# Patient Record
Sex: Female | Born: 1965 | Race: White | Hispanic: No | Smoking: Current every day smoker
Health system: Southern US, Community
[De-identification: ages and names within clinical notes are randomized; demographics above are authoritative.]

## PROBLEM LIST (undated history)

## (undated) DIAGNOSIS — E785 Hyperlipidemia, unspecified: Secondary | ICD-10-CM

## (undated) DIAGNOSIS — F32A Depression, unspecified: Secondary | ICD-10-CM

## (undated) DIAGNOSIS — E669 Obesity, unspecified: Secondary | ICD-10-CM

## (undated) DIAGNOSIS — F329 Major depressive disorder, single episode, unspecified: Secondary | ICD-10-CM

## (undated) DIAGNOSIS — Z720 Tobacco use: Secondary | ICD-10-CM

## (undated) HISTORY — DX: Obesity, unspecified: E66.9

## (undated) HISTORY — DX: Major depressive disorder, single episode, unspecified: F32.9

## (undated) HISTORY — PX: APPENDECTOMY: SHX54

## (undated) HISTORY — PX: DILATION AND CURETTAGE OF UTERUS: SHX78

## (undated) HISTORY — PX: TONSILLECTOMY AND ADENOIDECTOMY: SHX28

## (undated) HISTORY — DX: Tobacco use: Z72.0

## (undated) HISTORY — DX: Hyperlipidemia, unspecified: E78.5

## (undated) HISTORY — DX: Depression, unspecified: F32.A

---

## 2005-09-25 ENCOUNTER — Ambulatory Visit: Payer: Self-pay

## 2006-10-28 ENCOUNTER — Ambulatory Visit: Payer: Self-pay | Admitting: Family Medicine

## 2009-04-20 ENCOUNTER — Ambulatory Visit: Payer: Self-pay

## 2011-08-26 ENCOUNTER — Ambulatory Visit: Payer: Self-pay

## 2011-10-06 LAB — HM MAMMOGRAPHY

## 2012-10-25 LAB — HM PAP SMEAR

## 2015-01-11 DIAGNOSIS — F329 Major depressive disorder, single episode, unspecified: Secondary | ICD-10-CM | POA: Insufficient documentation

## 2015-01-11 DIAGNOSIS — F17210 Nicotine dependence, cigarettes, uncomplicated: Secondary | ICD-10-CM | POA: Insufficient documentation

## 2015-01-11 DIAGNOSIS — F32A Depression, unspecified: Secondary | ICD-10-CM | POA: Insufficient documentation

## 2015-01-11 DIAGNOSIS — F17200 Nicotine dependence, unspecified, uncomplicated: Secondary | ICD-10-CM

## 2015-01-11 DIAGNOSIS — E669 Obesity, unspecified: Secondary | ICD-10-CM | POA: Insufficient documentation

## 2015-01-11 DIAGNOSIS — E785 Hyperlipidemia, unspecified: Secondary | ICD-10-CM | POA: Insufficient documentation

## 2015-01-12 ENCOUNTER — Encounter: Payer: Self-pay | Admitting: Unknown Physician Specialty

## 2015-01-12 ENCOUNTER — Ambulatory Visit (INDEPENDENT_AMBULATORY_CARE_PROVIDER_SITE_OTHER): Payer: No Typology Code available for payment source | Admitting: Unknown Physician Specialty

## 2015-01-12 VITALS — BP 118/80 | HR 92 | Temp 98.7°F | Wt 206.0 lb

## 2015-01-12 DIAGNOSIS — L509 Urticaria, unspecified: Secondary | ICD-10-CM | POA: Diagnosis not present

## 2015-01-12 DIAGNOSIS — F32A Depression, unspecified: Secondary | ICD-10-CM | POA: Insufficient documentation

## 2015-01-12 DIAGNOSIS — Z720 Tobacco use: Secondary | ICD-10-CM | POA: Insufficient documentation

## 2015-01-12 DIAGNOSIS — F329 Major depressive disorder, single episode, unspecified: Secondary | ICD-10-CM | POA: Insufficient documentation

## 2015-01-12 MED ORDER — PREDNISONE 10 MG PO TABS: 10.0000 mg | ORAL_TABLET | Freq: Every day | ORAL | Status: DC

## 2015-01-12 NOTE — Progress Notes (Signed)
   BP 118/80 mmHg  Pulse 92  Temp(Src) 98.7 F (37.1 C)  Wt 206 lb (93.441 kg)  SpO2 97%   Subjective:    Patient ID: Robin Bartlett, female    DOB: 05-16-1966, 49 y.o.   MRN: [id number]  HPI: Robin Bartlett is a 49 y.o. female  Chief Complaint  Patient presents with  . Rash    legs, arms for several weeks and now spreading. Has been trying OTC meds with no improvement.    Relevant past medical, surgical, family and social history reviewed and updated as indicated. Interim medical history since our last visit reviewed. Allergies and medications reviewed and updated.  Urticara: Reports rash started approximately two weeks ago on right lower extremity, right arm developed rash one week ago and left forarm developed rash one day ago. She denies any pruritus or weeping of rash. Rash appears larger when she is hot and minimizes as she cools off. She reports using benadryl and topical ointment with no change. She denies any change in diet, medications, chemical changes (ie laundry detergent, soap) or other household changes.She does report occasional acetaminophen or BC powder but not at time of rash appearing. Reports feeling well, no fevers or chills.  Review of Systems  Constitutional: Negative.  Negative for fever, chills, diaphoresis, activity change, appetite change, fatigue and unexpected weight change.  Respiratory: Negative.  Negative for cough, chest tightness, shortness of breath, wheezing and stridor.   Cardiovascular: Negative.  Negative for chest pain, palpitations and leg swelling.  Skin: Positive for rash. Negative for color change, pallor and wound.    Per HPI unless specifically indicated above     Objective:    BP 118/80 mmHg  Pulse 92  Temp(Src) 98.7 F (37.1 C)  Wt 206 lb (93.441 kg)  SpO2 97%  Wt Readings from Last 3 Encounters:  01/12/15 206 lb (93.441 kg)  10/07/13 209 lb (94.802 kg)    Physical Exam  Constitutional: She is oriented to person, place, and  time. She appears well-developed and well-nourished. No distress.  HENT:  Head: Normocephalic and atraumatic.  Eyes: Conjunctivae are normal. Pupils are equal, round, and reactive to light.  Neck: Normal range of motion.  Cardiovascular: Normal rate, regular rhythm, normal heart sounds and intact distal pulses.  Exam reveals no gallop and no friction rub.   No murmur heard. Pulmonary/Chest: Effort normal and breath sounds normal. No respiratory distress. She has no wheezes. She has no rales. She exhibits no tenderness.  Neurological: She is alert and oriented to person, place, and time.  Skin: Skin is warm and dry. Rash noted. She is not diaphoretic. No erythema. No pallor.  Psychiatric: She has a normal mood and affect. Her behavior is normal.        Assessment & Plan:     Problem List Items Addressed This Visit    None    Visit Diagnoses    Urticaria    -  Primary    New onset aproximately 2 weeks ago.         Follow up plan: Patient given prescription for prednisone taper. If no improvement she will contact the office.

## 2015-10-30 ENCOUNTER — Emergency Department
Admission: EM | Admit: 2015-10-30 | Discharge: 2015-10-30 | Disposition: A | Discharge status: Home / Self Care | Payer: No Typology Code available for payment source | Attending: Emergency Medicine | Admitting: Emergency Medicine

## 2015-10-30 DIAGNOSIS — R42 Dizziness and giddiness: Secondary | ICD-10-CM | POA: Diagnosis present

## 2015-10-30 DIAGNOSIS — F17210 Nicotine dependence, cigarettes, uncomplicated: Secondary | ICD-10-CM | POA: Diagnosis not present

## 2015-10-30 DIAGNOSIS — E785 Hyperlipidemia, unspecified: Secondary | ICD-10-CM | POA: Diagnosis not present

## 2015-10-30 DIAGNOSIS — Z7952 Long term (current) use of systemic steroids: Secondary | ICD-10-CM | POA: Diagnosis not present

## 2015-10-30 DIAGNOSIS — F329 Major depressive disorder, single episode, unspecified: Secondary | ICD-10-CM | POA: Insufficient documentation

## 2015-10-30 LAB — BASIC METABOLIC PANEL
Anion gap: 7 (ref 5–15)
BUN: 10 mg/dL (ref 6–20)
CHLORIDE: 107 mmol/L (ref 101–111)
CO2: 26 mmol/L (ref 22–32)
Calcium: 9.4 mg/dL (ref 8.9–10.3)
Creatinine, Ser: 0.62 mg/dL (ref 0.44–1.00)
GLUCOSE: 109 mg/dL — AB (ref 65–99)
Potassium: 4.3 mmol/L (ref 3.5–5.1)
SODIUM: 140 mmol/L (ref 135–145)

## 2015-10-30 LAB — CBC
HEMATOCRIT: 48.1 % — AB (ref 35.0–47.0)
HEMOGLOBIN: 15.8 g/dL (ref 12.0–16.0)
MCH: 30.2 pg (ref 26.0–34.0)
MCHC: 32.8 g/dL (ref 32.0–36.0)
MCV: 92.2 fL (ref 80.0–100.0)
Platelets: 163 10*3/uL (ref 150–440)
RBC: 5.22 MIL/uL — ABNORMAL HIGH (ref 3.80–5.20)
RDW: 14.8 % — ABNORMAL HIGH (ref 11.5–14.5)
WBC: 7 10*3/uL (ref 3.6–11.0)

## 2015-10-30 LAB — URINALYSIS COMPLETE WITH MICROSCOPIC (ARMC ONLY)
BACTERIA UA: NONE SEEN
Bilirubin Urine: NEGATIVE
Glucose, UA: NEGATIVE mg/dL
Ketones, ur: NEGATIVE mg/dL
LEUKOCYTES UA: NEGATIVE
NITRITE: NEGATIVE
PROTEIN: NEGATIVE mg/dL
SPECIFIC GRAVITY, URINE: 1.006 (ref 1.005–1.030)
pH: 6 (ref 5.0–8.0)

## 2015-10-30 MED ORDER — MECLIZINE HCL 25 MG PO TABS
25.0000 mg | ORAL_TABLET | Freq: Once | ORAL | Status: AC
  Administered 2015-10-30: 25 mg via ORAL
  Filled 2015-10-30: qty 1

## 2015-10-30 MED ORDER — MECLIZINE HCL 25 MG PO TABS: 25.0000 mg | ORAL_TABLET | Freq: Three times a day (TID) | ORAL | Status: DC | PRN

## 2015-10-30 MED ORDER — SODIUM CHLORIDE 0.9 % IV BOLUS (SEPSIS)
500.0000 mL | Freq: Once | INTRAVENOUS | Status: AC
  Administered 2015-10-30: 500 mL via INTRAVENOUS

## 2015-10-30 MED ORDER — ONDANSETRON HCL 4 MG/2ML IJ SOLN
4.0000 mg | Freq: Once | INTRAMUSCULAR | Status: AC
  Administered 2015-10-30: 4 mg via INTRAVENOUS
  Filled 2015-10-30: qty 2

## 2015-10-30 MED ORDER — ONDANSETRON HCL 4 MG PO TABS: 4.0000 mg | ORAL_TABLET | Freq: Every day | ORAL | Status: DC | PRN

## 2015-10-30 NOTE — Discharge Instructions (Signed)
Benign Positional Vertigo Vertigo is the feeling that you or your surroundings are moving when they are not. Benign positional vertigo is the most common form of vertigo. The cause of this condition is not serious (is benign). This condition is triggered by certain movements and positions (is positional). This condition can be dangerous if it occurs while you are doing something that could endanger you or others, such as driving.  CAUSES In many cases, the cause of this condition is not known. It may be caused by a disturbance in an area of the inner ear that helps your brain to sense movement and balance. This disturbance can be caused by a viral infection (labyrinthitis), head injury, or repetitive motion. RISK FACTORS This condition is more likely to develop in:  Women.  People who are 50 years of age or older. SYMPTOMS Symptoms of this condition usually happen when you move your head or your eyes in different directions. Symptoms may start suddenly, and they usually last for less than a minute. Symptoms may include:  Loss of balance and falling.  Feeling like you are spinning or moving.  Feeling like your surroundings are spinning or moving.  Nausea and vomiting.  Blurred vision.  Dizziness.  Involuntary eye movement (nystagmus). Symptoms can be mild and cause only slight annoyance, or they can be severe and interfere with daily life. Episodes of benign positional vertigo may return (recur) over time, and they may be triggered by certain movements. Symptoms may improve over time. DIAGNOSIS This condition is usually diagnosed by medical history and a physical exam of the head, neck, and ears. You may be referred to a health care provider who specializes in ear, nose, and throat (ENT) problems (otolaryngologist) or a provider who specializes in disorders of the nervous system (neurologist). You may have additional testing, including:  MRI.  A CT scan.  Eye movement tests. Your  health care provider may ask you to change positions quickly while he or she watches you for symptoms of benign positional vertigo, such as nystagmus. Eye movement may be tested with an electronystagmogram (ENG), caloric stimulation, the Dix-Hallpike test, or the roll test.  An electroencephalogram (EEG). This records electrical activity in your brain.  Hearing tests. TREATMENT Usually, your health care provider will treat this by moving your head in specific positions to adjust your inner ear back to normal. Surgery may be needed in severe cases, but this is rare. In some cases, benign positional vertigo may resolve on its own in 2-4 weeks. HOME CARE INSTRUCTIONS Safety  Move slowly.Avoid sudden body or head movements.  Avoid driving.  Avoid operating heavy machinery.  Avoid doing any tasks that would be dangerous to you or others if a vertigo episode would occur.  If you have trouble walking or keeping your balance, try using a cane for stability. If you feel dizzy or unstable, sit down right away.  Return to your normal activities as told by your health care provider. Ask your health care provider what activities are safe for you. General Instructions  Take over-the-counter and prescription medicines only as told by your health care provider.  Avoid certain positions or movements as told by your health care provider.  Drink enough fluid to keep your urine clear or pale yellow.  Keep all follow-up visits as told by your health care provider. This is important. SEEK MEDICAL CARE IF:  You have a fever.  Your condition gets worse or you develop new symptoms.  Your family or friends   notice any behavioral changes.  Your nausea or vomiting gets worse.  You have numbness or a "pins and needles" sensation. SEEK IMMEDIATE MEDICAL CARE IF:  You have difficulty speaking or moving.  You are always dizzy.  You faint.  You develop severe headaches.  You have weakness in your  legs or arms.  You have changes in your hearing or vision.  You develop a stiff neck.  You develop sensitivity to light.   This information is not intended to replace advice given to you by your health care provider. Make sure you discuss any questions you have with your health care provider.   Document Released: 03/03/2006 Document Revised: 02/14/2015 Document Reviewed: 09/18/2014 Elsevier Interactive Patient Education 2016 [employer]  

## 2015-10-30 NOTE — ED Provider Notes (Addendum)
[facility] Emergency Department Provider Note  ____________________________________________   I have reviewed the triage vital signs and the nursing notes.   HISTORY  Chief Complaint Dizziness and Nausea    HPI Robin Bartlett is a 50 y.o. female who is healthy, does smoke, does take alcohol "a few beers" daily. Patient states that she has no history of vertigo. She was in her normal state of health last night. She did have "a few beers" but did not come intoxicated or fall or hit her head. She woke up this point with true vertigo. A sensation of spinning. When she sits still she feels fine when she moves, she has a sensation of spinning and nausea. She did vomit. Nonbloody nonbilious emesis. Patient denies any focal numbness or weakness, she denies any change in vision or hearing or difficulty speaking. She states that she feels the spinning sensation and that is it. She does have a history of "sinus problems". But no recent URI or other symptoms. She denies any diarrhea, chest pain or shortness of breath. She does not recall having had this symptom in the past except for when she was a child and on a tire swing.      Past Medical History  Diagnosis Date  . Obesity   . Hyperlipidemia   . Depression   . Tobacco use     Patient Active Problem List   Diagnosis Date Noted  . Depression   . Tobacco use   . Obesity 01/11/2015  . Tobacco dependence 01/11/2015  . Hyperlipidemia 01/11/2015  . Depressive disorder 01/11/2015    Past Surgical History  Procedure Laterality Date  . Appendectomy    . Cesarean section    . Dilation and curettage of uterus    . Tonsillectomy and adenoidectomy      Current Outpatient Rx  Name  Route  Sig  Dispense  Refill  . predniSONE (DELTASONE) 10 MG tablet   Oral   Take 1 tablet (10 mg total) by mouth daily with breakfast. Take 6 for 3 days then 4 for 3 days, then 2 for 3 days, then 1 for 3 days.   39 tablet   0      Allergies Review of patient's allergies indicates no known allergies.  Family History  Problem Relation Age of Onset  . Diabetes Mother   . Heart disease Mother   . Hypertension Mother   . Stroke Mother     Social History Social History  Substance Use Topics  . Smoking status: Current Every Day Smoker -- 1.00 packs/day for 30 years    Types: Cigarettes  . Smokeless tobacco: Never Used  . Alcohol Use: No     Comment: ocasionally    Review of Systems Constitutional: No fever/chills Eyes: No visual changes. ENT: No sore throat. No stiff neck no neck pain Cardiovascular: Denies chest pain. Respiratory: Denies shortness of breath. Gastrointestinal:   Positive vomiting.  No diarrhea.  No constipation. Genitourinary: Negative for dysuria. Musculoskeletal: Negative lower extremity swelling Skin: Negative for rash. Neurological: Negative for headaches, focal weakness or numbness. 10-point ROS otherwise negative.  ____________________________________________   PHYSICAL EXAM:  VITAL SIGNS: ED Triage Vitals  Enc Vitals Group     BP 10/30/15 1247 127/74 mmHg     Pulse Rate 10/30/15 1247 79     Resp 10/30/15 1247 18     Temp 10/30/15 1247 98 F (36.7 C)     Temp Source 10/30/15 1247 Oral  SpO2 10/30/15 1247 99 %     Weight 10/30/15 1247 180 lb (81.647 kg)     Height 10/30/15 1247  (1.727 m)     Head Cir --      Peak Flow --      Pain Score 10/30/15 1247 0     Pain Loc --      Pain Edu? --      Excl. in GC? --     Constitutional: Alert and oriented. Well appearing and in no acute distress. Eyes: Conjunctivae are normal. PERRL. EOMI. Head: Atraumatic. Nose: No congestion/rhinnorhea.The TMs are normal bilaterally Mouth/Throat: Mucous membranes are moist.  Oropharynx non-erythematous. Neck: No stridor.   Nontender with no meningismus Cardiovascular: Normal rate, regular rhythm. Grossly normal heart sounds.  Good peripheral circulation. Respiratory: Normal  respiratory effort.  No retractions. Lungs CTAB. Abdominal: Soft and nontender. No distention. No guarding no rebound Back:  There is no focal tenderness or step off there is no midline tenderness there are no lesions noted. there is no CVA tenderness Musculoskeletal: No lower extremity tenderness. No joint effusions, no DVT signs strong distal pulses no edema Neurologic:  Cranial nerves II through XII are grossly intact 5 out of 5 strength bilateral upper and lower extremity. Finger to nose within normal limits heel to shin within normal limits, speech is normal with no word finding difficulty or dysarthria, reflexes symmetric, pupils are equally round and reactive to light, there is no pronator drift, sensation is normal, vision is intact to confrontation, gait is deferred, there is no nystagmus, normal neurologic exam, there are a few beats of nystatin was noted to lateral vision, as well as reproducible vertiginous symptoms with dix-Hallpike maneuver Skin:  Skin is warm, dry and intact. No rash noted. Psychiatric: Mood and affect are normal. Speech and behavior are normal.  ____________________________________________   LABS (all labs ordered are listed, but only abnormal results are displayed)  Labs Reviewed  BASIC METABOLIC PANEL - Abnormal; Notable for the following:    Glucose, Bld 109 (*)    All other components within normal limits  CBC - Abnormal; Notable for the following:    RBC 5.22 (*)    HCT 48.1 (*)    RDW 14.8 (*)    All other components within normal limits  URINALYSIS COMPLETEWITH MICROSCOPIC ([facility] ONLY)  CBG MONITORING, ED   ____________________________________________  EKG  I personally interpreted any EKGs ordered by me or triage No sinus rhythm at 73 bpm no acute ST elevation or acute ST depression normal axis unremarkable EKG ____________________________________________  RADIOLOGY  I reviewed any imaging ordered by me or triage that were performed during  my shift and, if possible, patient and/or family made aware of any abnormal findings. ____________________________________________   PROCEDURES  Procedure(s) performed: None  Critical Care performed: None  ____________________________________________   INITIAL IMPRESSION / ASSESSMENT AND PLAN / ED COURSE  Pertinent labs & imaging results that were available during my care of the patient were reviewed by me and considered in my medical decision making (see chart for details).  She with true vertigo which is fatigable, reproducible, with no other neurologic signs or symptoms. At this time this is very consistent with a peripheral vertigo. There are no red flights at this time to indicate that the patient has a posterior CVA, vertebral dissection, mass or other acute pathology about Friday causing her to have acute fatigable Dix-Hallpike positive vertigo the context of "chronic allergies". We will give her Antivert, IV  fluids aggressively work and reassess. Vital signs are reassuring.  ----------------------------------------- 5:04 PM on 10/30/2015 -----------------------------------------  After Antivert all patient's symptoms resolved she is able to ambulate with no difficulty no other signs or symptoms of acute pathology,Cranial nerves II through XII are grossly intact 5 out of 5 strength bilateral upper and lower extremity. Finger to nose within normal limits heel to shin within normal limits, speech is normal with no word finding difficulty or dysarthria, reflexes symmetric, pupils are equally round and reactive to light, there is no pronator drift, sensation is normal, vision is intact to confrontation, gait is deferred, there is no nystagmus, normal neurologic exam patient eager to go home requesting discharge. We will send her home with Antivert outpatient neurology follow-up at this time there is no evidence of CVA, this is a fatigable reproducible positional vertigo. Return percussion  follow given understood patient will follow closely with neurology. ____________________________________________   FINAL CLINICAL IMPRESSION(S) / ED DIAGNOSES  Final diagnoses:  Vertigo      This chart was dictated using voice recognition software.  Despite best efforts to proofread,  errors can occur which can change meaning.     Jeanmarie PlantJames A Amalia Edgecombe, MD 10/30/15 1438  Jeanmarie PlantJames A Adoni Greenough, MD 10/30/15 [phone]

## 2015-10-30 NOTE — ED Notes (Signed)
Pt states she woke up around 630am with dizziness and nausea.. Denies hx fo vertigo. States she felt fine when she went to bed last night..Marland Kitchen

## 2016-12-23 ENCOUNTER — Other Ambulatory Visit: Payer: Self-pay | Admitting: Family Medicine

## 2016-12-23 DIAGNOSIS — Z1231 Encounter for screening mammogram for malignant neoplasm of breast: Secondary | ICD-10-CM

## 2016-12-25 ENCOUNTER — Ambulatory Visit
Admission: RE | Admit: 2016-12-25 | Discharge: 2016-12-25 | Disposition: A | Discharge status: Home / Self Care | Payer: BLUE CROSS/BLUE SHIELD | Source: Ambulatory Visit | Attending: Family Medicine | Admitting: Family Medicine

## 2016-12-25 DIAGNOSIS — Z1231 Encounter for screening mammogram for malignant neoplasm of breast: Secondary | ICD-10-CM | POA: Diagnosis present

## 2017-11-30 ENCOUNTER — Other Ambulatory Visit: Payer: Self-pay | Admitting: Family Medicine

## 2017-11-30 DIAGNOSIS — Z1231 Encounter for screening mammogram for malignant neoplasm of breast: Secondary | ICD-10-CM

## 2017-12-28 ENCOUNTER — Ambulatory Visit
Admission: RE | Admit: 2017-12-28 | Discharge: 2017-12-28 | Disposition: A | Discharge status: Home / Self Care | Payer: BLUE CROSS/BLUE SHIELD | Source: Ambulatory Visit | Attending: Family Medicine | Admitting: Family Medicine

## 2017-12-28 DIAGNOSIS — Z1231 Encounter for screening mammogram for malignant neoplasm of breast: Secondary | ICD-10-CM | POA: Diagnosis present

## 2017-12-29 ENCOUNTER — Other Ambulatory Visit: Payer: Self-pay | Admitting: Family Medicine

## 2017-12-29 DIAGNOSIS — R928 Other abnormal and inconclusive findings on diagnostic imaging of breast: Secondary | ICD-10-CM

## 2017-12-29 DIAGNOSIS — N6320 Unspecified lump in the left breast, unspecified quadrant: Secondary | ICD-10-CM

## 2017-12-29 DIAGNOSIS — N6489 Other specified disorders of breast: Secondary | ICD-10-CM

## 2018-01-07 ENCOUNTER — Ambulatory Visit
Admission: RE | Admit: 2018-01-07 | Discharge: 2018-01-07 | Disposition: A | Discharge status: Home / Self Care | Payer: BLUE CROSS/BLUE SHIELD | Source: Ambulatory Visit | Attending: Family Medicine | Admitting: Family Medicine

## 2018-01-07 DIAGNOSIS — N6320 Unspecified lump in the left breast, unspecified quadrant: Secondary | ICD-10-CM | POA: Diagnosis present

## 2018-01-07 DIAGNOSIS — N6489 Other specified disorders of breast: Secondary | ICD-10-CM

## 2018-01-07 DIAGNOSIS — R928 Other abnormal and inconclusive findings on diagnostic imaging of breast: Secondary | ICD-10-CM | POA: Diagnosis not present

## 2018-01-15 ENCOUNTER — Other Ambulatory Visit: Payer: No Typology Code available for payment source

## 2018-01-15 ENCOUNTER — Ambulatory Visit: Payer: No Typology Code available for payment source

## 2018-08-19 ENCOUNTER — Other Ambulatory Visit: Payer: Self-pay | Admitting: Family Medicine

## 2018-08-19 DIAGNOSIS — R922 Inconclusive mammogram: Secondary | ICD-10-CM

## 2019-02-01 ENCOUNTER — Encounter (INDEPENDENT_AMBULATORY_CARE_PROVIDER_SITE_OTHER): Payer: Self-pay

## 2019-02-01 ENCOUNTER — Ambulatory Visit
Admission: RE | Admit: 2019-02-01 | Discharge: 2019-02-01 | Disposition: A | Discharge status: Home / Self Care | Payer: BC Managed Care – PPO | Source: Ambulatory Visit | Attending: Family Medicine | Admitting: Family Medicine

## 2019-02-01 ENCOUNTER — Other Ambulatory Visit: Payer: Self-pay

## 2019-02-01 ENCOUNTER — Other Ambulatory Visit: Payer: Self-pay | Admitting: Family Medicine

## 2019-02-01 DIAGNOSIS — M7989 Other specified soft tissue disorders: Secondary | ICD-10-CM

## 2019-02-01 DIAGNOSIS — R229 Localized swelling, mass and lump, unspecified: Secondary | ICD-10-CM | POA: Insufficient documentation

## 2019-08-05 IMAGING — MG MM DIGITAL SCREENING BILAT W/ TOMO W/ CAD
9 of 12 series · 9 of 28 positions shown · non-contrast
Comparison: Previous exam(s).

CLINICAL DATA: Screening.

EXAM:
DIGITAL SCREENING BILATERAL MAMMOGRAM WITH TOMO AND CAD

[R MLO synth-2D]
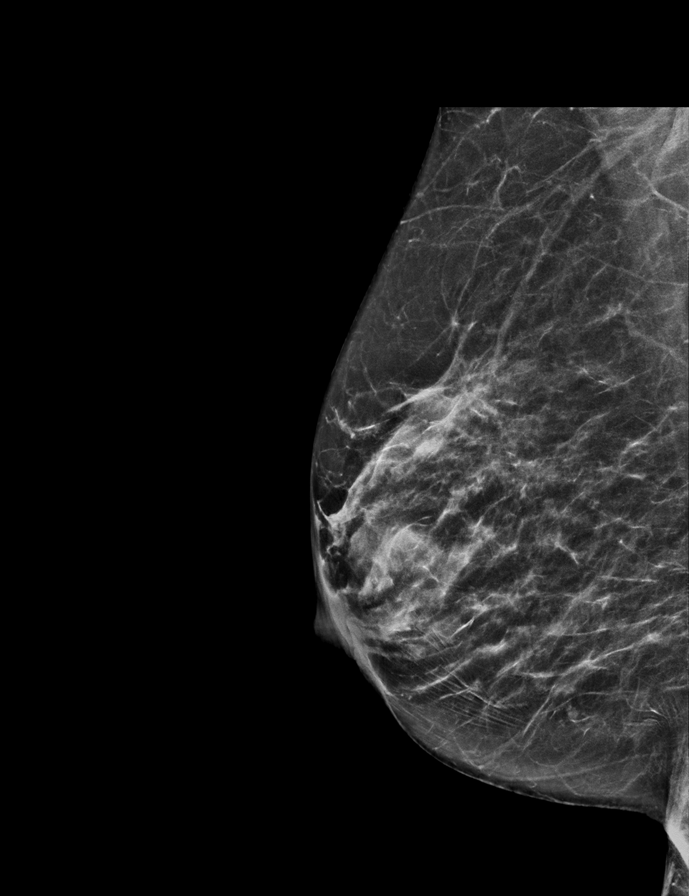

[L CC synth-2D]
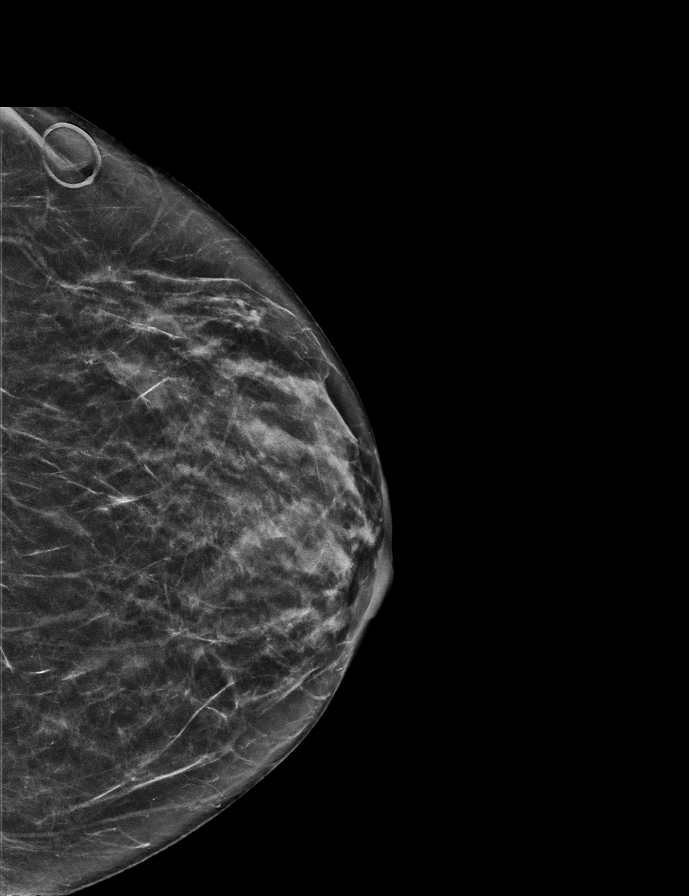

[R CC synth-2D]
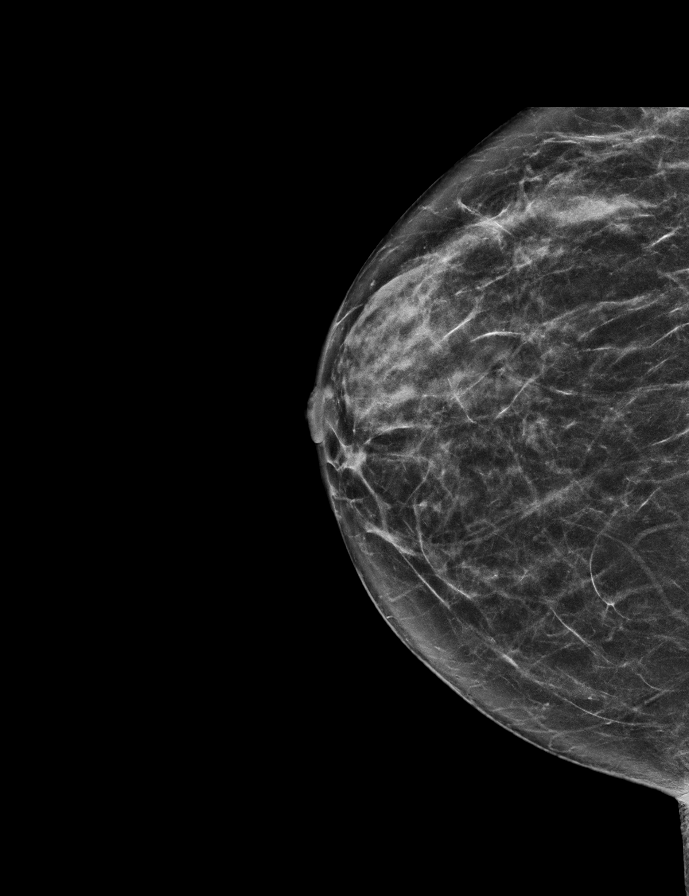

[L MLO synth-2D]
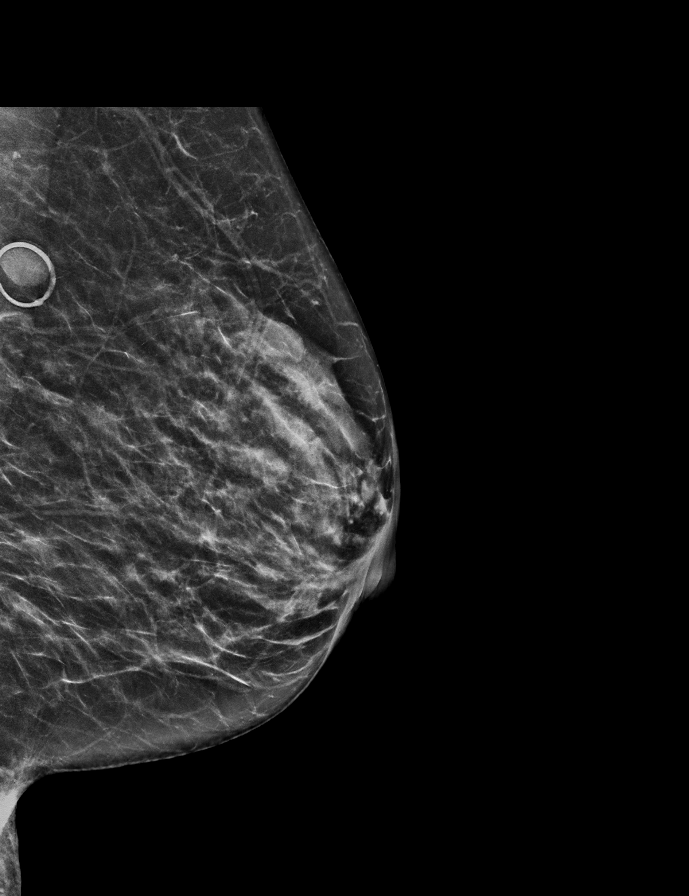

[L MLO tomo · tomo slice 31/60.0]
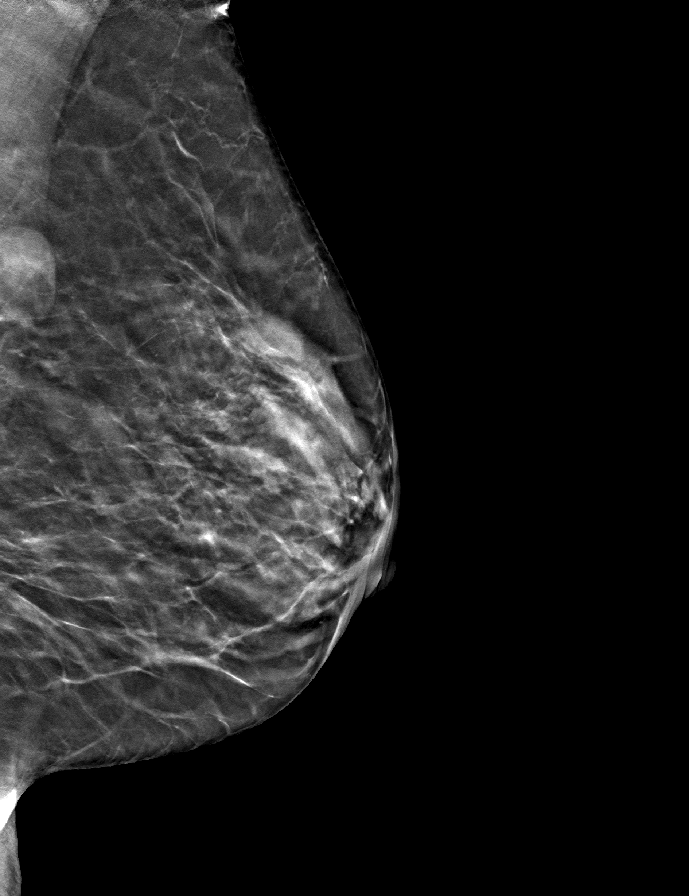

[L CC]
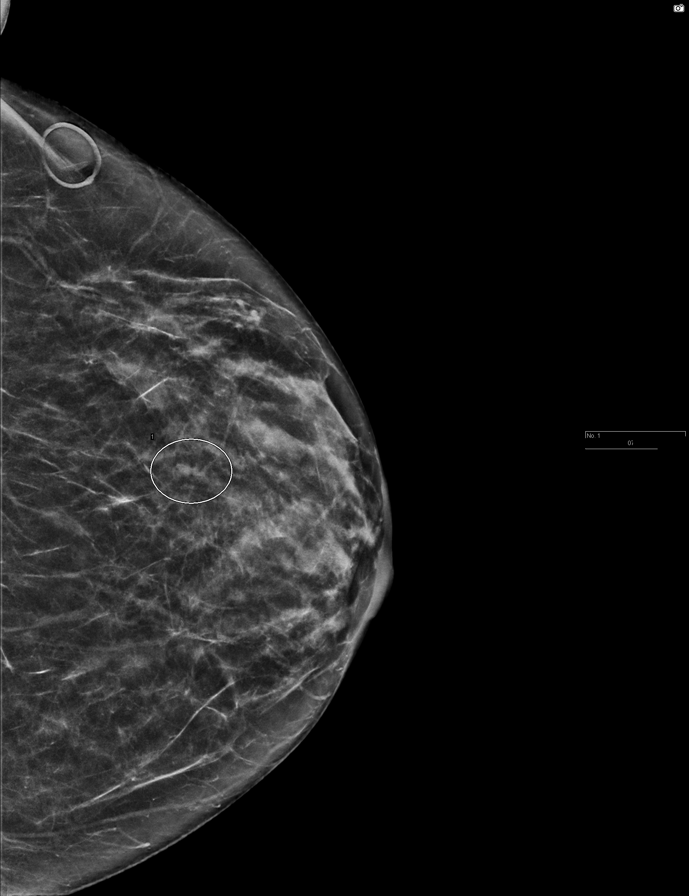

[L MLO (1 of 3)]
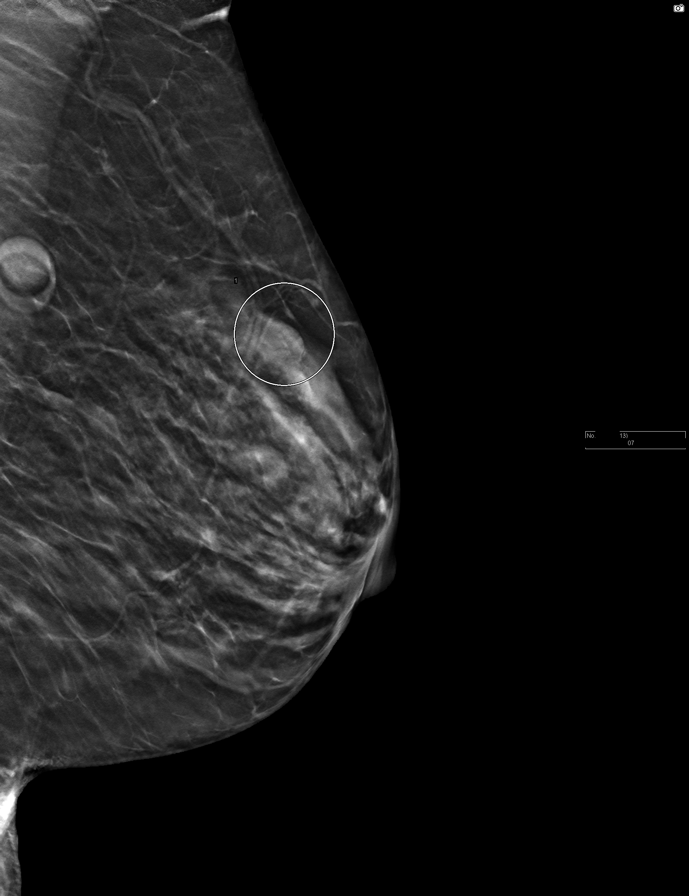

[L MLO (2 of 3)]
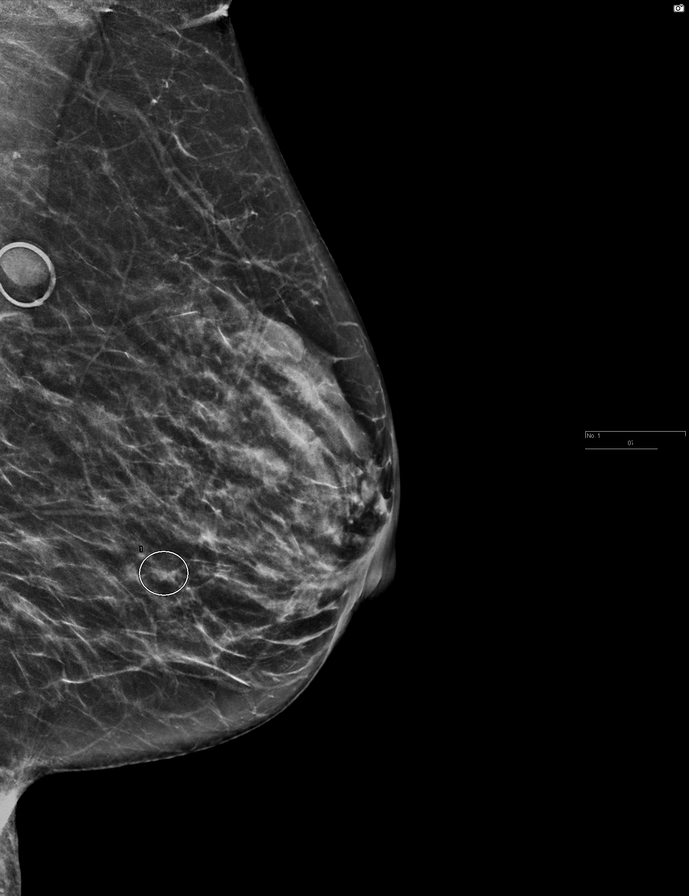

[L MLO (3 of 3)]
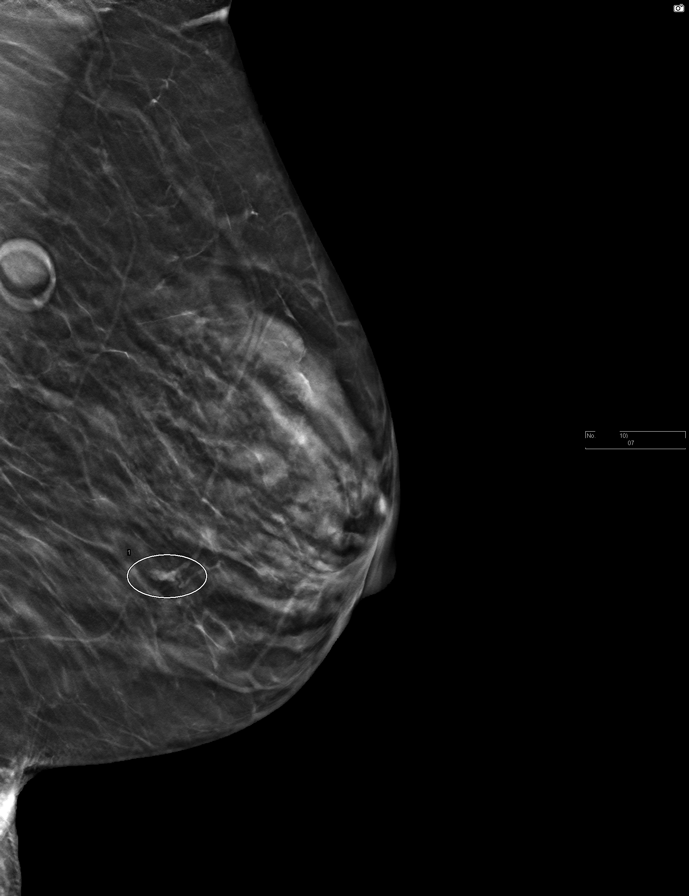

[9 of 28 positions shown; findings below may reference images not displayed]

ACR Breast Density Category c: The breast tissue is heterogeneously
dense, which may obscure small masses.
FINDINGS: In the left breast, a possible mass and asymmetry warrant further
evaluation. In the right breast, no findings suspicious for
malignancy.

Images were processed with CAD.
IMPRESSION: Further evaluation is suggested for possible mass and asymmetry in
the left breast.

RECOMMENDATION:
Diagnostic mammogram and possibly ultrasound of the left breast.
(Code:[id number])

The patient will be contacted regarding the findings, and additional
imaging will be scheduled.

BI-RADS CATEGORY  0: Incomplete. Need additional imaging evaluation
and/or prior mammograms for comparison.

## 2021-07-02 ENCOUNTER — Other Ambulatory Visit: Payer: Self-pay | Admitting: Unknown Physician Specialty

## 2021-07-02 DIAGNOSIS — Z139 Encounter for screening, unspecified: Secondary | ICD-10-CM

## 2021-07-03 ENCOUNTER — Other Ambulatory Visit: Payer: Self-pay

## 2021-07-03 ENCOUNTER — Ambulatory Visit
Admission: RE | Admit: 2021-07-03 | Discharge: 2021-07-03 | Disposition: A | Discharge status: Home / Self Care | Payer: BC Managed Care – PPO | Source: Ambulatory Visit | Attending: Unknown Physician Specialty | Admitting: Unknown Physician Specialty

## 2021-07-03 DIAGNOSIS — Z139 Encounter for screening, unspecified: Secondary | ICD-10-CM

## 2022-03-06 DIAGNOSIS — Z23 Encounter for immunization: Secondary | ICD-10-CM | POA: Diagnosis not present

## 2022-04-18 DIAGNOSIS — R059 Cough, unspecified: Secondary | ICD-10-CM | POA: Diagnosis not present

## 2022-04-18 DIAGNOSIS — J069 Acute upper respiratory infection, unspecified: Secondary | ICD-10-CM | POA: Diagnosis not present

## 2022-05-05 DIAGNOSIS — J069 Acute upper respiratory infection, unspecified: Secondary | ICD-10-CM | POA: Diagnosis not present

## 2022-05-05 DIAGNOSIS — R059 Cough, unspecified: Secondary | ICD-10-CM | POA: Diagnosis not present

## 2022-07-02 ENCOUNTER — Other Ambulatory Visit: Payer: Self-pay | Admitting: Unknown Physician Specialty

## 2022-07-02 DIAGNOSIS — Z1231 Encounter for screening mammogram for malignant neoplasm of breast: Secondary | ICD-10-CM

## 2022-07-09 ENCOUNTER — Ambulatory Visit
Admission: RE | Admit: 2022-07-09 | Discharge: 2022-07-09 | Disposition: A | Discharge status: Home / Self Care | Payer: BC Managed Care – PPO | Source: Ambulatory Visit | Attending: Unknown Physician Specialty | Admitting: Unknown Physician Specialty

## 2022-07-09 DIAGNOSIS — Z1231 Encounter for screening mammogram for malignant neoplasm of breast: Secondary | ICD-10-CM | POA: Diagnosis not present

## 2023-03-29 DIAGNOSIS — L03818 Cellulitis of other sites: Secondary | ICD-10-CM | POA: Diagnosis not present

## 2023-06-04 DIAGNOSIS — M4716 Other spondylosis with myelopathy, lumbar region: Secondary | ICD-10-CM | POA: Insufficient documentation

## 2023-06-04 DIAGNOSIS — M4316 Spondylolisthesis, lumbar region: Secondary | ICD-10-CM | POA: Diagnosis not present

## 2023-06-04 DIAGNOSIS — M47896 Other spondylosis, lumbar region: Secondary | ICD-10-CM | POA: Diagnosis not present

## 2023-07-02 ENCOUNTER — Encounter: Payer: Self-pay | Admitting: Nurse Practitioner

## 2023-07-02 ENCOUNTER — Ambulatory Visit: Payer: BC Managed Care – PPO | Admitting: Nurse Practitioner

## 2023-07-02 VITALS — BP 132/74 | HR 79 | Temp 97.9°F | Ht 67.7 in | Wt 140.6 lb

## 2023-07-02 DIAGNOSIS — E559 Vitamin D deficiency, unspecified: Secondary | ICD-10-CM | POA: Insufficient documentation

## 2023-07-02 DIAGNOSIS — E782 Mixed hyperlipidemia: Secondary | ICD-10-CM | POA: Diagnosis not present

## 2023-07-02 DIAGNOSIS — Z789 Other specified health status: Secondary | ICD-10-CM | POA: Diagnosis not present

## 2023-07-02 DIAGNOSIS — F325 Major depressive disorder, single episode, in full remission: Secondary | ICD-10-CM

## 2023-07-02 DIAGNOSIS — Z23 Encounter for immunization: Secondary | ICD-10-CM

## 2023-07-02 DIAGNOSIS — F17210 Nicotine dependence, cigarettes, uncomplicated: Secondary | ICD-10-CM

## 2023-07-02 DIAGNOSIS — Z7689 Persons encountering health services in other specified circumstances: Secondary | ICD-10-CM

## 2023-07-02 NOTE — Assessment & Plan Note (Signed)
Currently 6 beers daily, Natural Light.  Recommend she work on cutting back on this and discussed risk factors with her.  Check labs today.

## 2023-07-02 NOTE — Assessment & Plan Note (Signed)
Chronic, for years per patient.  She takes Vitamin D3 2000 units per day.  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  Will check level today and adjust supplement as needed.

## 2023-07-02 NOTE — Assessment & Plan Note (Signed)
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  Will continue to monitor mood and initiate medication as needed.  Denies SI/HI.

## 2023-07-02 NOTE — Patient Instructions (Signed)

## 2023-07-02 NOTE — Assessment & Plan Note (Signed)
I have recommended complete cessation of tobacco use. I have discussed various options available for assistance with tobacco cessation including over the counter methods (Nicotine gum, patch and lozenges). We also discussed prescription options (Chantix, Nicotine Inhaler / Nasal Spray). The patient is not interested in pursuing any prescription tobacco cessation options at this time.  

## 2023-07-02 NOTE — Assessment & Plan Note (Signed)
Noted past labs.  Is a smoker, higher risk.  Check labs today and upon return determine if need for treatment.

## 2023-07-02 NOTE — Progress Notes (Addendum)
New Patient Office Visit  Subjective    Patient ID: Robin Bartlett, female    DOB: 1966-03-19  Age: 58 y.o. MRN: [id number]  CC:  Chief Complaint  Patient presents with   Depression   Hyperlipidemia    HPI [name] presents for new patient visit to establish care.  Introduced to Publishing rights manager role and practice setting.  All questions answered.  Discussed provider/patient relationship and expectations.  HYPERLIPIDEMIA Recently had labs at work, but was elevated due to not fasting.  Works for [employer].  She is a smoker, smokes 1 PPD.  Has smoked for about 40 years.  Not interested in quitting at this time.  Drinks a six pack every day after work, [name].  Has been drinking this much after work since 07-15-2014. Hyperlipidemia status: good compliance Supplements: none Aspirin:  no The ASCVD Risk score (Arnett DK, et al., 07-15-17) failed to calculate for the following reasons:   Cannot find a previous HDL lab   Cannot find a previous total cholesterol lab Chest pain:  no Coronary artery disease:  no Family history CAD:  yes -- mother and father Family history early CAD:  no   DEPRESSION History of when father passed away in 2012-07-15.   Mood status: stable Depressed mood: no Anxious mood: no Anhedonia: no Significant weight loss or gain: no Insomnia: none Fatigue: no Feelings of worthlessness or guilt: no Impaired concentration/indecisiveness: no Suicidal ideations: no Hopelessness: no Crying spells: no    07/02/2023    8:11 AM  Depression screen PHQ 2/9  Decreased Interest 0  Down, Depressed, Hopeless 0  PHQ - 2 Score 0  Altered sleeping 0  Tired, decreased energy 0  Change in appetite 0  Feeling bad or failure about yourself  0  Trouble concentrating 0  Moving slowly or fidgety/restless 0  Suicidal thoughts 0  PHQ-9 Score 0  Difficult doing work/chores Not difficult at all       07/02/2023    8:11 AM  GAD 7 : Generalized Anxiety Score  Nervous,  Anxious, on Edge 0  Control/stop worrying 0  Worry too much - different things 0  Trouble relaxing 0  Restless 0  Easily annoyed or irritable 0  Afraid - awful might happen 0  Total GAD 7 Score 0  Anxiety Difficulty Not difficult at all   Outpatient Encounter Medications as of 07/02/2023  Medication Sig   [DISCONTINUED] meclizine (ANTIVERT) 25 MG tablet Take 1 tablet (25 mg total) by mouth 3 (three) times daily as needed.   [DISCONTINUED] ondansetron (ZOFRAN) 4 MG tablet Take 1 tablet (4 mg total) by mouth daily as needed for nausea or vomiting.   [DISCONTINUED] predniSONE (DELTASONE) 10 MG tablet Take 1 tablet (10 mg total) by mouth daily with breakfast. Take 6 for 3 days then 4 for 3 days, then 2 for 3 days, then 1 for 3 days.   No facility-administered encounter medications on file as of 07/02/2023.   Past Medical History:  Diagnosis Date   Depression    Hyperlipidemia    Obesity    Tobacco use     Past Surgical History:  Procedure Laterality Date   APPENDECTOMY     CESAREAN SECTION     DILATION AND CURETTAGE OF UTERUS     TONSILLECTOMY AND ADENOIDECTOMY      Family History  Problem Relation Age of Onset   Diabetes Mother    Heart disease Mother    Hypertension Mother  Stroke Mother    Stroke Father    Breast cancer Maternal Aunt        58's   Breast cancer Maternal Grandfather     Social History   Socioeconomic History   Marital status: Divorced    Spouse name: Not on file   Number of children: Not on file   Years of education: Not on file   Highest education level: Not on file  Occupational History   Not on file  Tobacco Use   Smoking status: Every Day    Current packs/day: 1.00    Average packs/day: 1 pack/day for 30.0 years (30.0 ttl pk-yrs)    Types: Cigarettes   Smokeless tobacco: Never  Vaping Use   Vaping status: Never Used  Substance and Sexual Activity   Alcohol use: Yes    Alcohol/week: 5.0 standard drinks of alcohol    Types: 5 Cans of  beer per week   Drug use: No   Sexual activity: Yes  Other Topics Concern   Not on file  Social History Narrative   Not on file   Social Drivers of Health   Financial Resource Strain: Low Risk  (07/02/2023)   Overall Financial Resource Strain (CARDIA)    Difficulty of Paying Living Expenses: Not hard at all  Food Insecurity: No Food Insecurity (07/02/2023)   Hunger Vital Sign    Worried About Running Out of Food in the Last Year: Never true    Ran Out of Food in the Last Year: Never true  Transportation Needs: No Transportation Needs (07/02/2023)   PRAPARE - Administrator, Civil Service (Medical): No    Lack of Transportation (Non-Medical): No  Physical Activity: Sufficiently Active (07/02/2023)   Exercise Vital Sign    Days of Exercise per Week: 5 days    Minutes of Exercise per Session: 30 min  Stress: Stress Concern Present (07/02/2023)   [employer] of Occupational Health - Occupational Stress Questionnaire    Feeling of Stress : To some extent  Social Connections: Moderately Isolated (07/02/2023)   Social Connection and Isolation Panel [NHANES]    Frequency of Communication with Friends and Family: Three times a week    Frequency of Social Gatherings with Friends and Family: Three times a week    Attends Religious Services: Never    Active Member of Clubs or Organizations: No    Attends Banker Meetings: Never    Marital Status: Living with partner  Intimate Partner Violence: Not At Risk (07/02/2023)   Humiliation, Afraid, Rape, and Kick questionnaire    Fear of Current or Ex-Partner: No    Emotionally Abused: No    Physically Abused: No    Sexually Abused: No    Review of Systems  Constitutional:  Negative for chills, diaphoresis, fever and weight loss.  Respiratory:  Negative for cough, shortness of breath and wheezing.   Cardiovascular:  Negative for chest pain, palpitations, orthopnea and leg swelling.  Neurological: Negative.    Endo/Heme/Allergies: Negative.   Psychiatric/Behavioral: Negative.          Objective    BP 132/74 (BP Location: Left Arm, Cuff Size: Normal)   Pulse 79   Temp 97.9 F (36.6 C) (Oral)   Ht 5' 7.7" (1.72 m)   Wt 140 lb 9.6 oz (63.8 kg)   SpO2 93%   BMI 21.57 kg/m   Physical Exam Vitals and nursing note reviewed.  Constitutional:      General: She is awake.  She is not in acute distress.    Appearance: She is well-developed, well-groomed and underweight. She is not ill-appearing or toxic-appearing.  HENT:     Head: Normocephalic.     Right Ear: Hearing and external ear normal.     Left Ear: Hearing and external ear normal.  Eyes:     General: Lids are normal.        Right eye: No discharge.        Left eye: No discharge.     Conjunctiva/sclera: Conjunctivae normal.     Pupils: Pupils are equal, round, and reactive to light.  Neck:     Thyroid: No thyromegaly.     Vascular: No carotid bruit.  Cardiovascular:     Rate and Rhythm: Normal rate and regular rhythm.     Heart sounds: Normal heart sounds. No murmur heard.    No gallop.  Pulmonary:     Effort: Pulmonary effort is normal. No accessory muscle usage or respiratory distress.     Breath sounds: Normal breath sounds.  Abdominal:     General: Bowel sounds are normal. There is no distension.     Palpations: Abdomen is soft.     Tenderness: There is no abdominal tenderness.  Musculoskeletal:     Cervical back: Normal range of motion and neck supple.     Right lower leg: No edema.     Left lower leg: No edema.  Lymphadenopathy:     Cervical: No cervical adenopathy.  Skin:    General: Skin is warm and dry.  Neurological:     Mental Status: She is alert and oriented to person, place, and time.     Deep Tendon Reflexes: Reflexes are normal and symmetric.     Reflex Scores:      Brachioradialis reflexes are 2+ on the right side and 2+ on the left side.      Patellar reflexes are 2+ on the right side and 2+ on the  left side. Psychiatric:        Attention and Perception: Attention normal.        Mood and Affect: Mood normal.        Speech: Speech normal.        Behavior: Behavior normal. Behavior is cooperative.        Thought Content: Thought content normal.    Last CBC Lab Results  Component Value Date   WBC 7.0 10/30/2015   HGB 15.8 10/30/2015   HCT 48.1 (H) 10/30/2015   MCV 92.2 10/30/2015   MCH 30.2 10/30/2015   RDW 14.8 (H) 10/30/2015   PLT 163 10/30/2015   Last metabolic panel Lab Results  Component Value Date   GLUCOSE 109 (H) 10/30/2015   NA 140 10/30/2015   K 4.3 10/30/2015   CL 107 10/30/2015   CO2 26 10/30/2015   BUN 10 10/30/2015   CREATININE 0.62 10/30/2015   GFRNONAA >60 10/30/2015   CALCIUM 9.4 10/30/2015   ANIONGAP 7 10/30/2015    Assessment & Plan:   Problem List Items Addressed This Visit       Other   Alcohol use   Currently 6 beers daily, [employer].  Recommend she work on cutting back on this and discussed risk factors with her.  Check labs today.      Depression - Primary   In remission, situational years ago due to loss of father.  Will continue to monitor mood and initiate medication as needed.  Denies SI/HI.  Relevant Orders   TSH   Hyperlipidemia   Noted past labs.  Is a smoker, higher risk.  Check labs today and upon return determine if need for treatment.      Relevant Orders   Comprehensive metabolic panel   Lipid Panel w/o Chol/HDL Ratio   Nicotine dependence, cigarettes, uncomplicated   I have recommended complete cessation of tobacco use. I have discussed various options available for assistance with tobacco cessation including over the counter methods (Nicotine gum, patch and lozenges). We also discussed prescription options (Chantix, Nicotine Inhaler / Nasal Spray). The patient is not interested in pursuing any prescription tobacco cessation options at this time.       Relevant Orders   CBC with Differential/Platelet    Vitamin D deficiency   Chronic, for years per patient.  She takes Vitamin D3 2000 units per day.  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  Will check level today and adjust supplement as needed.      Relevant Orders   VITAMIN D 25 Hydroxy (Vit-D Deficiency, Fractures)   Other Visit Diagnoses       Pneumococcal vaccination given       PCV20 in office today, is a smoker and over age 73.   Relevant Orders   Pneumococcal conjugate vaccine 20-valent (Prevnar 20) (Completed)     Encounter to establish care       New patient to clinic, introduced to provider and offie setting.       Return in about 4 weeks (around 07/30/2023) for Annual Physical with pap.   Marjie Skiff, NP

## 2023-07-03 ENCOUNTER — Other Ambulatory Visit: Payer: Self-pay | Admitting: Nurse Practitioner

## 2023-07-03 ENCOUNTER — Encounter: Payer: Self-pay | Admitting: Nurse Practitioner

## 2023-07-03 LAB — COMPREHENSIVE METABOLIC PANEL
ALT: 12 [IU]/L (ref 0–32)
AST: 18 [IU]/L (ref 0–40)
Albumin: 4.8 g/dL (ref 3.8–4.9)
Alkaline Phosphatase: 70 [IU]/L (ref 44–121)
BUN/Creatinine Ratio: 19 (ref 9–23)
BUN: 11 mg/dL (ref 6–24)
Bilirubin Total: 0.4 mg/dL (ref 0.0–1.2)
CO2: 23 mmol/L (ref 20–29)
Calcium: 9.8 mg/dL (ref 8.7–10.2)
Chloride: 102 mmol/L (ref 96–106)
Creatinine, Ser: 0.59 mg/dL (ref 0.57–1.00)
Globulin, Total: 2 g/dL (ref 1.5–4.5)
Glucose: 85 mg/dL (ref 70–99)
Potassium: 4.4 mmol/L (ref 3.5–5.2)
Sodium: 141 mmol/L (ref 134–144)
Total Protein: 6.8 g/dL (ref 6.0–8.5)
eGFR: 104 mL/min/{1.73_m2} (ref >59)

## 2023-07-03 LAB — TSH: TSH: 0.581 u[IU]/mL (ref 0.450–4.500)

## 2023-07-03 LAB — CBC WITH DIFFERENTIAL/PLATELET
Basophils Absolute: 0.1 10*3/uL (ref 0.0–0.2)
Basos: 1 %
EOS (ABSOLUTE): 0.2 10*3/uL (ref 0.0–0.4)
Eos: 2 %
Hematocrit: 44 % (ref 34.0–46.6)
Hemoglobin: 14.1 g/dL (ref 11.1–15.9)
Immature Grans (Abs): 0 10*3/uL (ref 0.0–0.1)
Immature Granulocytes: 0 %
Lymphocytes Absolute: 2.4 10*3/uL (ref 0.7–3.1)
Lymphs: 30 %
MCH: 30.3 pg (ref 26.6–33.0)
MCHC: 32 g/dL (ref 31.5–35.7)
MCV: 95 fL (ref 79–97)
Monocytes Absolute: 0.5 10*3/uL (ref 0.1–0.9)
Monocytes: 7 %
Neutrophils Absolute: 4.7 10*3/uL (ref 1.4–7.0)
Neutrophils: 60 %
Platelets: 258 10*3/uL (ref 150–450)
RBC: 4.65 x10E6/uL (ref 3.77–5.28)
RDW: 12.6 % (ref 11.7–15.4)
WBC: 7.9 10*3/uL (ref 3.4–10.8)

## 2023-07-03 LAB — VITAMIN D 25 HYDROXY (VIT D DEFICIENCY, FRACTURES): Vit D, 25-Hydroxy: 12 ng/mL — ABNORMAL LOW (ref 30.0–100.0)

## 2023-07-03 LAB — LIPID PANEL W/O CHOL/HDL RATIO
Cholesterol, Total: 229 mg/dL — ABNORMAL HIGH (ref 100–199)
HDL: 100 mg/dL (ref >39)
LDL Chol Calc (NIH): 120 mg/dL — ABNORMAL HIGH (ref 0–99)
Triglycerides: 55 mg/dL (ref 0–149)
VLDL Cholesterol Cal: 9 mg/dL (ref 5–40)

## 2023-07-03 MED ORDER — CHOLECALCIFEROL 1.25 MG (50000 UT) PO CAPS: 50000.0000 [IU] | ORAL_CAPSULE | ORAL | 2 refills | Status: DC

## 2023-07-03 NOTE — Progress Notes (Signed)
Contacted via MyChart The 10-year ASCVD risk score (Arnett DK, et al., 2019) is: 4.5%   Values used to calculate the score:     Age: 58 years     Sex: Female     Is Non-Hispanic African American: No     Diabetic: No     Tobacco smoker: Yes     Systolic Blood Pressure: 132 mmHg     Is BP treated: No     HDL Cholesterol: 100 mg/dL     Total Cholesterol: 229 mg/dL   Good afternoon Robin Bartlett, your labs have returned: - Lipid panel does show elevations in LDL, bad cholesterol, and total cholesterol.  However, on calculation of risk for stroke or heart event in next 10 years is low at present.  I recommend heavy focus on healthy diet and regular exercise.  If needed in future we will start medication. - Vitamin D level is low.  I am going to send in a weekly supplement for you to start and see if we can bring this up. - Kidney function, creatinine and eGFR, remains normal, as is liver function, AST and ALT.  - Remainder of labs stable.  Any questions? Keep being amazing!!  Thank you for allowing me to participate in your care.  I appreciate you. Kindest regards, Devinn Hurwitz

## 2023-07-31 ENCOUNTER — Other Ambulatory Visit: Payer: Self-pay | Admitting: Nurse Practitioner

## 2023-07-31 DIAGNOSIS — Z1231 Encounter for screening mammogram for malignant neoplasm of breast: Secondary | ICD-10-CM

## 2023-08-01 NOTE — Patient Instructions (Signed)

## 2023-08-04 ENCOUNTER — Ambulatory Visit (INDEPENDENT_AMBULATORY_CARE_PROVIDER_SITE_OTHER): Payer: Self-pay | Admitting: Nurse Practitioner

## 2023-08-04 ENCOUNTER — Other Ambulatory Visit (HOSPITAL_COMMUNITY)
Admission: RE | Admit: 2023-08-04 | Discharge: 2023-08-04 | Disposition: A | Discharge status: Home / Self Care | Payer: BC Managed Care – PPO | Source: Ambulatory Visit | Attending: Nurse Practitioner | Admitting: Nurse Practitioner

## 2023-08-04 ENCOUNTER — Encounter: Payer: Self-pay | Admitting: Nurse Practitioner

## 2023-08-04 VITALS — BP 118/74 | HR 74 | Temp 97.8°F | Ht 67.7 in | Wt 141.2 lb

## 2023-08-04 DIAGNOSIS — E559 Vitamin D deficiency, unspecified: Secondary | ICD-10-CM

## 2023-08-04 DIAGNOSIS — E782 Mixed hyperlipidemia: Secondary | ICD-10-CM

## 2023-08-04 DIAGNOSIS — Z124 Encounter for screening for malignant neoplasm of cervix: Secondary | ICD-10-CM | POA: Diagnosis not present

## 2023-08-04 DIAGNOSIS — F17210 Nicotine dependence, cigarettes, uncomplicated: Secondary | ICD-10-CM | POA: Diagnosis not present

## 2023-08-04 DIAGNOSIS — Z0000 Encounter for general adult medical examination without abnormal findings: Secondary | ICD-10-CM

## 2023-08-04 DIAGNOSIS — Z1159 Encounter for screening for other viral diseases: Secondary | ICD-10-CM | POA: Diagnosis not present

## 2023-08-04 DIAGNOSIS — Z114 Encounter for screening for human immunodeficiency virus [HIV]: Secondary | ICD-10-CM

## 2023-08-04 DIAGNOSIS — Z789 Other specified health status: Secondary | ICD-10-CM

## 2023-08-04 NOTE — Assessment & Plan Note (Signed)
 Chronic, ongoing.  Continue supplement and adjust as needed.  Labs at next physical.

## 2023-08-04 NOTE — Assessment & Plan Note (Signed)
 Ongoing.  Noted on labs.  Is a smoker, higher risk.  Recheck labs in one year.  Focus on diet and exercise at present.  Cut back on smoking and alcohol use. The 10-year ASCVD risk score (Arnett DK, et al., 2019) is: 3.6%   Values used to calculate the score:     Age: 58 years     Sex: Female     Is Non-Hispanic African American: No     Diabetic: No     Tobacco smoker: Yes     Systolic Blood Pressure: 118 mmHg     Is BP treated: No     HDL Cholesterol: 100 mg/dL     Total Cholesterol: 229 mg/dL

## 2023-08-04 NOTE — Progress Notes (Signed)
 BP 118/74 (BP Location: Left Arm, Patient Position: Sitting, Cuff Size: Normal)   Pulse 74   Temp 97.8 F (36.6 C) (Oral)   Ht 5' 7.7" (1.72 m)   Wt 141 lb 3.2 oz (64 kg)   SpO2 98%   BMI 21.66 kg/m    Subjective:    Patient ID: Robin Bartlett, female    DOB: 11/26/1965, 58 y.o.   MRN: [id number]  HPI: Robin Bartlett is a 58 y.o. female presenting on 08/04/2023 for comprehensive medical examination. Current medical complaints include:none  She currently lives with: boyfriend Menopausal Symptoms: no  HYPERLIPIDEMIA No current medications.  Continues to smoke daily.  Taking Vitamin D supplement for history of low levels. Supplements: none Aspirin:  no The 10-year ASCVD risk score (Arnett DK, et al., 2019) is: 3.6%   Values used to calculate the score:     Age: 57 years     Sex: Female     Is Non-Hispanic African American: No     Diabetic: No     Tobacco smoker: Yes     Systolic Blood Pressure: 118 mmHg     Is BP treated: No     HDL Cholesterol: 100 mg/dL     Total Cholesterol: 229 mg/dL Chest pain:  no Coronary artery disease:  no Family history CAD:  yes Family history early CAD:  no   Depression Screen done today and results listed below:     08/04/2023    1:06 PM 07/02/2023    8:11 AM  Depression screen PHQ 2/9  Decreased Interest 0 0  Down, Depressed, Hopeless 0 0  PHQ - 2 Score 0 0  Altered sleeping 0 0  Tired, decreased energy 0 0  Change in appetite 0 0  Feeling bad or failure about yourself  0 0  Trouble concentrating 0 0  Moving slowly or fidgety/restless 0 0  Suicidal thoughts 0 0  PHQ-9 Score 0 0  Difficult doing work/chores Not difficult at all Not difficult at all      08/04/2023    1:06 PM 07/02/2023    8:11 AM  GAD 7 : Generalized Anxiety Score  Nervous, Anxious, on Edge 0 0  Control/stop worrying 0 0  Worry too much - different things 0 0  Trouble relaxing 0 0  Restless 0 0  Easily annoyed or irritable 0 0  Afraid - awful might happen 0 0   Total GAD 7 Score 0 0  Anxiety Difficulty Not difficult at all Not difficult at all        08/04/2023    1:05 PM  Fall Risk  Falls in the past year? 0  Was there an injury with Fall? 0  Fall Risk Category Calculator 0  Patient at Risk for Falls Due to No Fall Risks  Fall risk Follow up Falls evaluation completed    Past Medical History:  Past Medical History:  Diagnosis Date   Depression    Hyperlipidemia    Obesity    Tobacco use     Surgical History:  Past Surgical History:  Procedure Laterality Date   APPENDECTOMY     CESAREAN SECTION     DILATION AND CURETTAGE OF UTERUS     TONSILLECTOMY AND ADENOIDECTOMY      Medications:  Current Outpatient Medications on File Prior to Visit  Medication Sig   Cholecalciferol (VITAMIN D) 50 MCG (2000 UT) CAPS Take 4,000 Units by mouth daily at 2 PM.   No current  facility-administered medications on file prior to visit.    Allergies:  No Known Allergies  Social History:  Social History   Socioeconomic History   Marital status: Divorced    Spouse name: Not on file   Number of children: Not on file   Years of education: Not on file   Highest education level: Not on file  Occupational History   Not on file  Tobacco Use   Smoking status: Every Day    Current packs/day: 1.00    Average packs/day: 1 pack/day for 30.0 years (30.0 ttl pk-yrs)    Types: Cigarettes   Smokeless tobacco: Never  Vaping Use   Vaping status: Never Used  Substance and Sexual Activity   Alcohol use: Yes    Alcohol/week: 5.0 standard drinks of alcohol    Types: 5 Cans of beer per week   Drug use: No   Sexual activity: Yes  Other Topics Concern   Not on file  Social History Narrative   Not on file   Social Drivers of Health   Financial Resource Strain: Low Risk  (07/02/2023)   Overall Financial Resource Strain (CARDIA)    Difficulty of Paying Living Expenses: Not hard at all  Food Insecurity: No Food Insecurity (07/02/2023)   Hunger  Vital Sign    Worried About Running Out of Food in the Last Year: Never true    Ran Out of Food in the Last Year: Never true  Transportation Needs: No Transportation Needs (07/02/2023)   PRAPARE - Administrator, Civil Service (Medical): No    Lack of Transportation (Non-Medical): No  Physical Activity: Sufficiently Active (07/02/2023)   Exercise Vital Sign    Days of Exercise per Week: 5 days    Minutes of Exercise per Session: 30 min  Stress: Stress Concern Present (07/02/2023)   [employer] of Occupational Health - Occupational Stress Questionnaire    Feeling of Stress : To some extent  Social Connections: Moderately Isolated (07/02/2023)   Social Connection and Isolation Panel [NHANES]    Frequency of Communication with Friends and Family: Three times a week    Frequency of Social Gatherings with Friends and Family: Three times a week    Attends Religious Services: Never    Active Member of Clubs or Organizations: No    Attends Banker Meetings: Never    Marital Status: Living with partner  Intimate Partner Violence: Not At Risk (07/02/2023)   Humiliation, Afraid, Rape, and Kick questionnaire    Fear of Current or Ex-Partner: No    Emotionally Abused: No    Physically Abused: No    Sexually Abused: No   Social History   Tobacco Use  Smoking Status Every Day   Current packs/day: 1.00   Average packs/day: 1 pack/day for 30.0 years (30.0 ttl pk-yrs)   Types: Cigarettes  Smokeless Tobacco Never   Social History   Substance and Sexual Activity  Alcohol Use Yes   Alcohol/week: 5.0 standard drinks of alcohol   Types: 5 Cans of beer per week    Family History:  Family History  Problem Relation Age of Onset   Diabetes Mother    Heart disease Mother    Hypertension Mother    Stroke Mother    Stroke Father    Breast cancer Maternal Aunt        40's   Breast cancer Maternal Grandfather     Past medical history, surgical history, medications,  allergies, family history and social  history reviewed with patient today and changes made to appropriate areas of the chart.   ROS All other ROS negative except what is listed above and in the HPI.      Objective:    BP 118/74 (BP Location: Left Arm, Patient Position: Sitting, Cuff Size: Normal)   Pulse 74   Temp 97.8 F (36.6 C) (Oral)   Ht 5' 7.7" (1.72 m)   Wt 141 lb 3.2 oz (64 kg)   SpO2 98%   BMI 21.66 kg/m   Wt Readings from Last 3 Encounters:  08/04/23 141 lb 3.2 oz (64 kg)  07/02/23 140 lb 9.6 oz (63.8 kg)  10/30/15 180 lb (81.6 kg)    Physical Exam Vitals and nursing note reviewed. Exam conducted with a chaperone present.  Constitutional:      General: She is awake. She is not in acute distress.    Appearance: She is well-developed and well-groomed. She is not ill-appearing or toxic-appearing.  HENT:     Head: Normocephalic and atraumatic.     Right Ear: Hearing, tympanic membrane, ear canal and external ear normal. No drainage.     Left Ear: Hearing, tympanic membrane, ear canal and external ear normal. No drainage.     Nose: Nose normal.     Right Sinus: No maxillary sinus tenderness or frontal sinus tenderness.     Left Sinus: No maxillary sinus tenderness or frontal sinus tenderness.     Mouth/Throat:     Mouth: Mucous membranes are moist.     Pharynx: Oropharynx is clear. Uvula midline. No pharyngeal swelling, oropharyngeal exudate or posterior oropharyngeal erythema.  Eyes:     General: Lids are normal.        Right eye: No discharge.        Left eye: No discharge.     Extraocular Movements: Extraocular movements intact.     Conjunctiva/sclera: Conjunctivae normal.     Pupils: Pupils are equal, round, and reactive to light.     Visual Fields: Right eye visual fields normal and left eye visual fields normal.  Neck:     Thyroid: No thyromegaly.     Vascular: No carotid bruit.     Trachea: Trachea normal.  Cardiovascular:     Rate and Rhythm: Normal rate  and regular rhythm.     Heart sounds: Normal heart sounds. No murmur heard.    No gallop.  Pulmonary:     Effort: Pulmonary effort is normal. No accessory muscle usage or respiratory distress.     Breath sounds: Normal breath sounds.  Chest:  Breasts:    Right: Normal.     Left: Normal.  Abdominal:     General: Bowel sounds are normal.     Palpations: Abdomen is soft. There is no hepatomegaly or splenomegaly.     Tenderness: There is no abdominal tenderness.     Hernia: There is no hernia in the left inguinal area or right inguinal area.  Genitourinary:    Exam position: Lithotomy position.     Labia:        Right: No rash.        Left: No rash.      Urethra: No prolapse.     Vagina: Normal.     Cervix: Normal.     Uterus: Normal.      Adnexa: Right adnexa normal and left adnexa normal.     Comments: Cervix viewed, anterior and slight tilt left.  Pap obtained and sent to lab. Musculoskeletal:  General: Normal range of motion.     Cervical back: Normal range of motion and neck supple.     Right lower leg: No edema.     Left lower leg: No edema.  Lymphadenopathy:     Head:     Right side of head: No submental, submandibular, tonsillar, preauricular or posterior auricular adenopathy.     Left side of head: No submental, submandibular, tonsillar, preauricular or posterior auricular adenopathy.     Cervical: No cervical adenopathy.     Upper Body:     Right upper body: No supraclavicular, axillary or pectoral adenopathy.     Left upper body: No supraclavicular, axillary or pectoral adenopathy.  Skin:    General: Skin is warm and dry.     Capillary Refill: Capillary refill takes less than 2 seconds.     Findings: No rash.  Neurological:     Mental Status: She is alert and oriented to person, place, and time.     Gait: Gait is intact.     Deep Tendon Reflexes: Reflexes are normal and symmetric.     Reflex Scores:      Brachioradialis reflexes are 2+ on the right side  and 2+ on the left side.      Patellar reflexes are 2+ on the right side and 2+ on the left side. Psychiatric:        Attention and Perception: Attention normal.        Mood and Affect: Mood normal.        Speech: Speech normal.        Behavior: Behavior normal. Behavior is cooperative.        Thought Content: Thought content normal.        Judgment: Judgment normal.    Results for orders placed or performed in visit on 07/02/23  CBC with Differential/Platelet   Collection Time: 07/02/23  8:50 AM  Result Value Ref Range   WBC 7.9 3.4 - 10.8 x10E3/uL   RBC 4.65 3.77 - 5.28 x10E6/uL   Hemoglobin 14.1 11.1 - 15.9 g/dL   Hematocrit 40.9 81.1 - 46.6 %   MCV 95 79 - 97 fL   MCH 30.3 26.6 - 33.0 pg   MCHC 32.0 31.5 - 35.7 g/dL   RDW 91.4 78.2 - 95.6 %   Platelets 258 150 - 450 x10E3/uL   Neutrophils 60 Not Estab. %   Lymphs 30 Not Estab. %   Monocytes 7 Not Estab. %   Eos 2 Not Estab. %   Basos 1 Not Estab. %   Neutrophils Absolute 4.7 1.4 - 7.0 x10E3/uL   Lymphocytes Absolute 2.4 0.7 - 3.1 x10E3/uL   Monocytes Absolute 0.5 0.1 - 0.9 x10E3/uL   EOS (ABSOLUTE) 0.2 0.0 - 0.4 x10E3/uL   Basophils Absolute 0.1 0.0 - 0.2 x10E3/uL   Immature Granulocytes 0 Not Estab. %   Immature Grans (Abs) 0.0 0.0 - 0.1 x10E3/uL  Comprehensive metabolic panel   Collection Time: 07/02/23  8:50 AM  Result Value Ref Range   Glucose 85 70 - 99 mg/dL   BUN 11 6 - 24 mg/dL   Creatinine, Ser 2.13 0.57 - 1.00 mg/dL   eGFR 086 >57 QI/ONG/2.95   BUN/Creatinine Ratio 19 9 - 23   Sodium 141 134 - 144 mmol/L   Potassium 4.4 3.5 - 5.2 mmol/L   Chloride 102 96 - 106 mmol/L   CO2 23 20 - 29 mmol/L   Calcium 9.8 8.7 - 10.2 mg/dL   Total  Protein 6.8 6.0 - 8.5 g/dL   Albumin 4.8 3.8 - 4.9 g/dL   Globulin, Total 2.0 1.5 - 4.5 g/dL   Bilirubin Total 0.4 0.0 - 1.2 mg/dL   Alkaline Phosphatase 70 44 - 121 IU/L   AST 18 0 - 40 IU/L   ALT 12 0 - 32 IU/L  TSH   Collection Time: 07/02/23  8:50 AM  Result Value  Ref Range   TSH 0.581 0.450 - 4.500 uIU/mL  Lipid Panel w/o Chol/HDL Ratio   Collection Time: 07/02/23  8:50 AM  Result Value Ref Range   Cholesterol, Total 229 (H) 100 - 199 mg/dL   Triglycerides 55 0 - 149 mg/dL   HDL 161 >09 mg/dL   VLDL Cholesterol Cal 9 5 - 40 mg/dL   LDL Chol Calc (NIH) 604 (H) 0 - 99 mg/dL  VITAMIN D 25 Hydroxy (Vit-D Deficiency, Fractures)   Collection Time: 07/02/23  8:50 AM  Result Value Ref Range   Vit D, 25-Hydroxy 12.0 (L) 30.0 - 100.0 ng/mL      Assessment & Plan:   Problem List Items Addressed This Visit       Other   Hyperlipidemia - Primary   Ongoing.  Noted on labs.  Is a smoker, higher risk.  Recheck labs in one year.  Focus on diet and exercise at present.  Cut back on smoking and alcohol use. The 10-year ASCVD risk score (Arnett DK, et al., 2019) is: 3.6%   Values used to calculate the score:     Age: 35 years     Sex: Female     Is Non-Hispanic African American: No     Diabetic: No     Tobacco smoker: Yes     Systolic Blood Pressure: 118 mmHg     Is BP treated: No     HDL Cholesterol: 100 mg/dL     Total Cholesterol: 229 mg/dL       Nicotine dependence, cigarettes, uncomplicated   I have recommended complete cessation of tobacco use. I have discussed various options available for assistance with tobacco cessation including over the counter methods (Nicotine gum, patch and lozenges). We also discussed prescription options (Chantix, Nicotine Inhaler / Nasal Spray). The patient is not interested in pursuing any prescription tobacco cessation options at this time.  Recommend lung cancer screening, she wishes to hold off on this.      Vitamin D deficiency   Chronic, ongoing.  Continue supplement and adjust as needed.  Labs at next physical.      Other Visit Diagnoses       Encounter for screening for HIV       HIV screen on labs today per guidelines for one time screening, discussed with patient.   Relevant Orders   HIV Antibody  (routine testing w rflx)     Need for hepatitis C screening test       Hep C screen on labs today per guidelines for one time screening, discussed with patient.   Relevant Orders   Hepatitis C antibody     Cervical cancer screening       Pap obtained and sent to lab.   Relevant Orders   Cytology - PAP     Encounter for annual physical exam       Annual physical today with labs and health maintenance reviewed, discussed with patient.        Follow up plan: Return in about 1 year (around 08/03/2024) for Annual Physical.  LABORATORY TESTING:  - Pap smear: pap done  IMMUNIZATIONS:   - Tdap: Tetanus vaccination status reviewed: last tetanus booster within 10 years. - Influenza: Up to date - Pneumovax: Up to date - Prevnar: Not applicable - COVID: Up to date - HPV: Not applicable - Shingrix vaccine: Refused  SCREENING: -Mammogram:  is scheduled for this  on Monday at workplace - Colonoscopy: Refused  - Bone Density: Not applicable  -Hearing Test: Not applicable  -Spirometry: Refused   PATIENT COUNSELING:   Advised to take 1 mg of folate supplement per day if capable of pregnancy.   Sexuality: Discussed sexually transmitted diseases, partner selection, use of condoms, avoidance of unintended pregnancy  and contraceptive alternatives.   Advised to avoid cigarette smoking.  I discussed with the patient that most people either abstain from alcohol or drink within safe limits (<=14/week and <=4 drinks/occasion for males, <=7/weeks and <= 3 drinks/occasion for females) and that the risk for alcohol disorders and other health effects rises proportionally with the number of drinks per week and how often a drinker exceeds daily limits.  Discussed cessation/primary prevention of drug use and availability of treatment for abuse.   Diet: Encouraged to adjust caloric intake to maintain  or achieve ideal body weight, to reduce intake of dietary saturated fat and total fat, to limit  sodium intake by avoiding high sodium foods and not adding table salt, and to maintain adequate dietary potassium and calcium preferably from fresh fruits, vegetables, and low-fat dairy products.    Stressed the importance of regular exercise  Injury prevention: Discussed safety belts, safety helmets, smoke detector, smoking near bedding or upholstery.   Dental health: Discussed importance of regular tooth brushing, flossing, and dental visits.    NEXT PREVENTATIVE PHYSICAL DUE IN 1 YEAR. Return in about 1 year (around 08/03/2024) for Annual Physical.

## 2023-08-04 NOTE — Assessment & Plan Note (Addendum)
 I have recommended complete cessation of tobacco use. I have discussed various options available for assistance with tobacco cessation including over the counter methods (Nicotine gum, patch and lozenges). We also discussed prescription options (Chantix, Nicotine Inhaler / Nasal Spray). The patient is not interested in pursuing any prescription tobacco cessation options at this time.  Recommend lung cancer screening, she wishes to hold off on this.

## 2023-08-05 ENCOUNTER — Encounter: Payer: Self-pay | Admitting: Nurse Practitioner

## 2023-08-05 LAB — CYTOLOGY - PAP
Comment: NEGATIVE
Diagnosis: NEGATIVE
High risk HPV: NEGATIVE

## 2023-08-05 LAB — HEPATITIS C ANTIBODY: Hep C Virus Ab: NONREACTIVE

## 2023-08-05 LAB — HIV ANTIBODY (ROUTINE TESTING W REFLEX): HIV Screen 4th Generation wRfx: NONREACTIVE

## 2023-08-05 NOTE — Progress Notes (Signed)
 Contacted via MyChart   HIV and Hep C are negative:)

## 2023-08-10 ENCOUNTER — Ambulatory Visit
Admission: RE | Admit: 2023-08-10 | Discharge: 2023-08-10 | Disposition: A | Discharge status: Home / Self Care | Payer: BC Managed Care – PPO | Source: Ambulatory Visit | Attending: Nurse Practitioner | Admitting: Nurse Practitioner

## 2023-08-10 DIAGNOSIS — Z1231 Encounter for screening mammogram for malignant neoplasm of breast: Secondary | ICD-10-CM

## 2024-01-07 ENCOUNTER — Telehealth: Payer: Self-pay

## 2024-01-07 NOTE — Telephone Encounter (Unsigned)
 Copied from CRM #[id number]. Topic: Clinical - Medical Advice >> Jan 07, 2024 10:41 AM Wess RAMAN wrote: Reason for CRM: Patient is wondering how long she should be taken VITAMIN D  due to having pain in her legs  Callback #: [phone]

## 2024-01-08 NOTE — Telephone Encounter (Signed)
 Called and LVM asking for patient to please return my call.   OK for E2C2 to relay message to patient if she calls back.

## 2024-01-11 NOTE — Telephone Encounter (Signed)
 Called and LVM notifying patient of providers response.

## 2024-01-13 DIAGNOSIS — E559 Vitamin D deficiency, unspecified: Secondary | ICD-10-CM | POA: Diagnosis not present

## 2024-02-23 DIAGNOSIS — Z23 Encounter for immunization: Secondary | ICD-10-CM | POA: Diagnosis not present

## 2024-03-29 DIAGNOSIS — M4316 Spondylolisthesis, lumbar region: Secondary | ICD-10-CM | POA: Diagnosis not present

## 2024-03-29 DIAGNOSIS — M47816 Spondylosis without myelopathy or radiculopathy, lumbar region: Secondary | ICD-10-CM | POA: Diagnosis not present

## 2024-03-29 DIAGNOSIS — M47896 Other spondylosis, lumbar region: Secondary | ICD-10-CM | POA: Diagnosis not present
